# Patient Record
Sex: Male | Born: 1975 | Race: White | Hispanic: No | Marital: Single | Smoking: Current every day smoker
Health system: Southern US, Community
[De-identification: ages and names within clinical notes are randomized; demographics above are authoritative.]

## PROBLEM LIST (undated history)

## (undated) DIAGNOSIS — G8929 Other chronic pain: Secondary | ICD-10-CM

## (undated) DIAGNOSIS — M549 Dorsalgia, unspecified: Secondary | ICD-10-CM

## (undated) HISTORY — PX: LUNG SURGERY: SHX703

## (undated) HISTORY — PX: APPENDECTOMY: SHX54

## (undated) HISTORY — PX: WRIST SURGERY: SHX841

## (undated) HISTORY — PX: NOSE SURGERY: SHX723

---

## 2002-02-19 ENCOUNTER — Encounter: Payer: Self-pay | Admitting: Thoracic Surgery (Cardiothoracic Vascular Surgery)

## 2002-02-19 ENCOUNTER — Encounter: Payer: Self-pay | Admitting: Emergency Medicine

## 2002-02-19 ENCOUNTER — Observation Stay (HOSPITAL_COMMUNITY): Admission: EM | Admit: 2002-02-19 | Discharge: 2002-02-19 | Payer: Self-pay | Admitting: Emergency Medicine

## 2002-02-24 ENCOUNTER — Encounter: Payer: Self-pay | Admitting: Thoracic Surgery (Cardiothoracic Vascular Surgery)

## 2002-02-24 ENCOUNTER — Encounter
Admission: RE | Admit: 2002-02-24 | Discharge: 2002-02-24 | Payer: Self-pay | Admitting: Thoracic Surgery (Cardiothoracic Vascular Surgery)

## 2002-03-29 ENCOUNTER — Encounter: Payer: Self-pay | Admitting: Thoracic Surgery (Cardiothoracic Vascular Surgery)

## 2002-03-30 ENCOUNTER — Encounter (INDEPENDENT_AMBULATORY_CARE_PROVIDER_SITE_OTHER): Payer: Self-pay | Admitting: Specialist

## 2002-03-30 ENCOUNTER — Inpatient Hospital Stay (HOSPITAL_COMMUNITY)
Admission: RE | Admit: 2002-03-30 | Discharge: 2002-04-03 | Payer: Self-pay | Admitting: Thoracic Surgery (Cardiothoracic Vascular Surgery)

## 2002-03-30 ENCOUNTER — Encounter: Payer: Self-pay | Admitting: Thoracic Surgery (Cardiothoracic Vascular Surgery)

## 2002-03-31 ENCOUNTER — Encounter: Payer: Self-pay | Admitting: Thoracic Surgery

## 2002-04-01 ENCOUNTER — Encounter: Payer: Self-pay | Admitting: Thoracic Surgery

## 2002-04-01 ENCOUNTER — Encounter: Payer: Self-pay | Admitting: Thoracic Surgery (Cardiothoracic Vascular Surgery)

## 2002-04-02 ENCOUNTER — Encounter: Payer: Self-pay | Admitting: Thoracic Surgery (Cardiothoracic Vascular Surgery)

## 2002-04-03 ENCOUNTER — Encounter: Payer: Self-pay | Admitting: Thoracic Surgery

## 2002-04-21 ENCOUNTER — Encounter: Payer: Self-pay | Admitting: Thoracic Surgery (Cardiothoracic Vascular Surgery)

## 2002-04-21 ENCOUNTER — Encounter
Admission: RE | Admit: 2002-04-21 | Discharge: 2002-04-21 | Payer: Self-pay | Admitting: Thoracic Surgery (Cardiothoracic Vascular Surgery)

## 2002-10-24 ENCOUNTER — Emergency Department (HOSPITAL_COMMUNITY): Admission: EM | Admit: 2002-10-24 | Discharge: 2002-10-24 | Payer: Self-pay | Admitting: Emergency Medicine

## 2002-10-24 ENCOUNTER — Encounter: Payer: Self-pay | Admitting: Emergency Medicine

## 2002-11-18 ENCOUNTER — Emergency Department (HOSPITAL_COMMUNITY): Admission: EM | Admit: 2002-11-18 | Discharge: 2002-11-18 | Payer: Self-pay | Admitting: Emergency Medicine

## 2003-01-12 ENCOUNTER — Emergency Department (HOSPITAL_COMMUNITY): Admission: EM | Admit: 2003-01-12 | Discharge: 2003-01-12 | Payer: Self-pay | Admitting: Emergency Medicine

## 2003-01-12 ENCOUNTER — Encounter: Payer: Self-pay | Admitting: Emergency Medicine

## 2003-04-14 ENCOUNTER — Emergency Department (HOSPITAL_COMMUNITY): Admission: EM | Admit: 2003-04-14 | Discharge: 2003-04-14 | Payer: Self-pay | Admitting: Emergency Medicine

## 2003-07-27 ENCOUNTER — Emergency Department (HOSPITAL_COMMUNITY): Admission: EM | Admit: 2003-07-27 | Discharge: 2003-07-27 | Payer: Self-pay | Admitting: Emergency Medicine

## 2003-07-27 ENCOUNTER — Encounter: Payer: Self-pay | Admitting: Emergency Medicine

## 2003-08-02 ENCOUNTER — Emergency Department (HOSPITAL_COMMUNITY): Admission: EM | Admit: 2003-08-02 | Discharge: 2003-08-02 | Payer: Self-pay

## 2003-08-18 ENCOUNTER — Emergency Department (HOSPITAL_COMMUNITY): Admission: EM | Admit: 2003-08-18 | Discharge: 2003-08-18 | Payer: Self-pay | Admitting: Emergency Medicine

## 2003-11-01 ENCOUNTER — Ambulatory Visit (HOSPITAL_COMMUNITY): Admission: RE | Admit: 2003-11-01 | Discharge: 2003-11-01 | Payer: Self-pay | Admitting: Orthopedic Surgery

## 2003-12-23 ENCOUNTER — Ambulatory Visit (HOSPITAL_BASED_OUTPATIENT_CLINIC_OR_DEPARTMENT_OTHER): Admission: RE | Admit: 2003-12-23 | Discharge: 2003-12-23 | Payer: Self-pay | Admitting: Orthopedic Surgery

## 2005-03-06 ENCOUNTER — Encounter: Admission: RE | Admit: 2005-03-06 | Discharge: 2005-03-06 | Payer: Self-pay | Admitting: Otolaryngology

## 2005-03-19 ENCOUNTER — Emergency Department (HOSPITAL_COMMUNITY): Admission: EM | Admit: 2005-03-19 | Discharge: 2005-03-19 | Payer: Self-pay | Admitting: Emergency Medicine

## 2006-12-22 ENCOUNTER — Emergency Department (HOSPITAL_COMMUNITY): Admission: EM | Admit: 2006-12-22 | Discharge: 2006-12-22 | Payer: Self-pay | Admitting: Family Medicine

## 2007-01-28 ENCOUNTER — Emergency Department (HOSPITAL_COMMUNITY): Admission: EM | Admit: 2007-01-28 | Discharge: 2007-01-28 | Payer: Self-pay | Admitting: Family Medicine

## 2007-03-30 ENCOUNTER — Emergency Department (HOSPITAL_COMMUNITY): Admission: EM | Admit: 2007-03-30 | Discharge: 2007-03-30 | Payer: Self-pay | Admitting: Family Medicine

## 2011-04-19 NOTE — Op Note (Signed)
NAME:  Tyler Mcdaniel, Tyler Mcdaniel                           ACCOUNT NO.:  [id number]   MEDICAL RECORD NO.:  [id number]                   PATIENT TYPE:  AMB   LOCATION:  DSC                                  FACILITY:  [facility]   PHYSICIAN:  Harvie Junior, M.D.                DATE OF BIRTH:  [birth year]   DATE OF PROCEDURE:  12/23/2003  DATE OF DISCHARGE:                                 OPERATIVE REPORT   PREOPERATIVE DIAGNOSIS:  Scapholunate ligament injury with possible chondral  flap.   POSTOPERATIVE DIAGNOSIS:  Radioscaphocapitate ligament tear with some radial  and ulnar side synovitis.   PROCEDURE:  Debridement of radioscaphocapitate ligament with debridement of  radial and ulnar synovitis and debridement of partial tear of the  scapholunate ligament.   SURGEON:  Harvie Junior, M.D.   ASSISTANT:  __________   ANESTHESIA:  General.   HISTORY:  A 35 year old male with a long history of having had pain in the  wrist.  He had also been treated with anti-inflammatory medication and  activity modification and been seen by Dr. Mina Marble who is concerned a  little bit about a hamate fracture which ultimately had resolved.  He had  two MR arthrograms and a CT which essentially were read as normal.  Because  of this, he was ultimately evaluated.  He had been working throughout but  still persisted in having some dorsal wrist pain, some catching pain in the  wrist, and we felt that he improved significantly with an injection and  ultimately felt that given the improvement he had with an injection that we  ought to consider arthroscopic evaluation of his wrist and he was brought to  the operating room for this procedure.   DESCRIPTION OF PROCEDURE:  The patient was brought to the operating room and  after adequate anesthesia was obtained with general anesthesia, the patient  was placed on the operating table.  The right wrist was prepped and draped  in the usual sterile fashion.  Following this,  routine arthroscopic  examination of the wrist after exsanguination of the extremity and inflating  a tourniquet to 250 mmHg, showed that there was some tearing in the  radioscaphocapitate ligament.  This was debrided and the ligamentous  complexes were clearly defined and there seemed to be more on the lunate  side of the radioscapholunate ligament.  Once this was debrided, attention  was turned to the ulnar side of the wrist.  Triangular fibrocartilage was  identified and noted to be within normal limits.  The ulnar ligamentous  complex was completely normal.  There was a little bit of dorsal synovitis  and a little bit of chondral fray on the back side of the lunate.  This was  debrided ulnarly.  The camera was then reversed into the ulnar portal and  the radial side showed some dorsal side synovitis but the radial ligamentous  complex was graded, as well as the scaphoid and the articular surface on the  scaphoid were without significant abnormality.  At this point the wrist was  copiously irrigated and suctioned dry.  The arthroscopic ports were closed  with a bandage.  Sterile compressive dressing was applied as well as a volar  plaster splint and the patient was taken to the recovery room and noted to  be in satisfactory condition.  Estimated blood loss for the procedure was  none.                                               Harvie Junior, M.D.   Ranae Plumber  D:  12/23/2003  T:  12/23/2003  Job:  [id number]

## 2011-04-19 NOTE — H&P (Signed)
[place]. [facility]  Patient:    Tyler Mcdaniel, Tyler Mcdaniel Visit Number: [id number] MRN: [id number]          Service Type: MED Location: [id number] Attending Physician:  Charlett Lango Dictated by:   Sherrie George, P.A. Admit Date:  02/19/2002 Discharge Date: 02/19/2002                           History and Physical  DATE OF BIRTH:  12/14/1975  HISTORY OF PRESENT ILLNESS:  The patient is a 35 year old white male with three spontaneous left pneumothoraces and left VATS/stapling in August 2002, now presents with a second right pneumothorax.  The patient presented with acute onset of right-sided chest pain and breath sounds slightly diminished on the left.  Chest x-ray showed about a 20% right pneumothorax.  He was quite stable and was tolerating it well.  He was admitted for observation around 3 a.m. by Dr. Dorris Fetch.  Chest x-ray six hours later reveals the right-sided pneumothorax is still between 20% and 25%, minimally larger.  We plan to recheck the x-ray at 3 p.m. - which will be 12 hours - and see if there are any changes and decide on the need for a chest tube or follow-up as an outpatient.  PAST MEDICAL HISTORY: 1. He has had three left pneumothoraces with a left video-assisted    thoracoscopy, stapling of these blebs in August 2002 by Dr. Jabier Mutton in    [facility]. 2. He has had one prior right pneumothorax; this is his second on the right.  ALLERGIES:  None known.  HABITS:  ETOH:  About a six-pack per week.  Cigarettes:  About one pack per day.  Drugs:  None.  CURRENT MEDICATIONS:  None.  REVIEW OF SYSTEMS:  Was noncontributory and negative.  FAMILY HISTORY:  His father is living at age 51 but has already had one MI; he is a smoker.  His mother is living and in good health at age 57.  SOCIAL HISTORY:  He is single, works as a Geologist, engineering.  PHYSICAL EXAMINATION:  GENERAL:  Well-nourished, well-developed white male in no acute  distress.  HEENT:  Head:  Normocephalic.  Eyes:  PERRLA, EOMs intact.  Fundi not visualized.  Ears, nose, throat, and mouth:  Grossly within normal limits.  NECK:  No bruits.  CHEST:  Clear to auscultation and percussion.  Breath sounds are slightly diminished on the right.  HEART:  No murmurs, rubs, or gallops.  Pulses are +2 and equal bilaterally throughout the distribution.  ABDOMEN:  Soft, nontender, positive bowel sounds.  No palpable organomegaly.  GENITOURINARY/RECTAL:  Not performed, not medically indicated.  EXTREMITIES:  No clubbing, cyanosis, or edema.  NEUROLOGIC:  No focal deficits.  IMPRESSION: 1. Recurrent right spontaneous pneumothorax. 2. Status post left pneumothorax x3 status post video-assisted thoracoscopy    stapling on left.Dictated by:   Sherrie George, P.A. Attending Physician:  Charlett Lango DD:  02/19/02 TD:  02/20/02 Job: [id number]

## 2011-04-19 NOTE — H&P (Signed)
[place]. [facility]  Patient:    Tyler Mcdaniel, Tyler Mcdaniel Visit Number: [id number] MRN: [id number]          Service Type: SUR Location: [id number] Attending Physician:  Charlett Lango Dictated by:   Adair Patter, P.A. Admit Date:  03/30/2002 Discharge Date: 04/03/2002   CC:         CVTS Office   History and Physical  CHIEF COMPLAINT:  Right spontaneous pneumothorax.  HISTORY OF PRESENT ILLNESS:  This is a 35 year old male who has a history of a right spontaneous pneumothorax x 2.  The patient has a history of three left-sided spontaneous pneumothoraxes which eventually required left video-assisted thoracoscopy and bleb stabling.  Approximately one month ago, the patient had his second right spontaneous pneumothorax, which was treated with observation by Viviann Spare C. Dorris Fetch, M.D.  The patient currently denies any shortness of breath of pleuritic chest pain.  He says that he has not had any weight loss, cough, or sputum production.  PAST MEDICAL HISTORY: 1. History of left spontaneous pneumothorax x 3. 2. History of right spontaneous pneumothorax x 2.  PAST SURGICAL HISTORY:  Left video-assisted thoracoscopy with bleb stabling.  ALLERGIES:  The patient denies any known medication allergies.  MEDICATIONS:  The patient says that he currently takes no medications.  FAMILY HISTORY:  There is no family history of cancer, diabetes, or cerebrovascular accident.  His father is still living and had a heart attack and has hypertension.  SOCIAL HISTORY:  The patient is single.  He lives with his grandmother.  He drinks three to four drinks per week.  He smokes one pack of cigarettes per day and has done so for about seven years.  REVIEW OF SYSTEMS:  General:  The patient denies any fever, chills, night sweats, or frequent illnesses.  Head:  The patient denies any head injuries or loss of consciousness.  Eyes:  The patient wears corrective lenses.  He  denies any glaucoma, cataracts, or diplopia.  Ears:  The patient denies any hearing loss, ear infections, vertigo, or tenderness.  Nose:  The patient denies any epistaxis, dry nose, or sinusitis.  Mouth:  The patient denies any problems with dentition or frequent sore throats.  Neck:  The patient denies any lumps, masses or pain on range of motion in his neck.  Cardiovascular:  The patient denies any cardiac arrhythmias, hypertension, or angina.  Pulmonary:  The patient denies any asthma, bronchitis, emphysema, or pneumonia.  GI:  The patient denies any nausea, vomiting, diarrhea, constipation, hematochezia, or melena.  Endocrine:  The patient denies any diabetes or thyroid disease.  GU: The patient denies any urinary tract infections or incontinence. Musculoskeletal:  The patient denies any arthritis, arthralgias, or myalgias. Neurologic:  The patient denies any memory loss, depression, stroke, or transient ischemic attack.  PHYSICAL EXAMINATION:  VITAL SIGNS:  Blood pressure 110/70 bilaterally, pulse 84 and regular, respirations 16.  GENERAL APPEARANCE:  The patient is alert and oriented x 3.  He is in no distress.  HEENT:  Head:  Normocephalic and atraumatic.  Eyes:  The pupils are equal, round, and reactive to light and accommodation.  Extraocular movements are intact.  No scleral icterus or nystagmus.  Ears:  Auditory acuity is grossly intact.  Nose:  Nasal patency is intact.  The sinuses are nontender.  Mouth: Moist without exudates.  NECK:  Supple without JVD, lymphadenopathy, carotid bruits, or thyromegaly.  CARDIOVASCULAR:  Regular rate and rhythm without murmurs, rubs, or gallops.  LUNGS:  Bilaterally clear to auscultation without rhonchi, rales, or wheezes.  ABDOMEN:  Soft, nontender, and nondistended.  Positive bowel sounds in all four quadrants.  EXTREMITIES:  No clubbing, cyanosis, or edema.  NEUROLOGIC:  Cranial nerves II-XII grossly intact.  No focal  neurologic deficits are noted.  He has 5+ equal muscle strength in all four extremities.  IMPRESSION:  Right spontaneous pneumothorax.  PLAN:  The patient will be admitted to [facility]. [facility], at which time he will undergo a right video-assisted thoracoscopy with blebectomy and mechanical pleurodesis by Salvatore Decent. Dorris Fetch, M.D. Dictated by:   Adair Patter, P.A. Attending Physician:  Charlett Lango DD:  03/29/02 TD:  03/29/02 Job: [id number]

## 2011-04-19 NOTE — Discharge Summary (Signed)
. [facility]  Patient:    Tyler Mcdaniel, Tyler Mcdaniel Visit Number: [id number] MRN: [id number]          Service Type: SUR Location: 3300 [id number] [id number] Attending Physician:  Charlett Lango Dictated by:   Sherrie George, P.A. Admit Date:  03/30/2002 Discharge Date: 04/03/2002                             Discharge Summary  DATE OF BIRTH: 1976-07-25.  ADMISSION DIAGNOSES: 1. Recurrent right pneumothorax. 2. Status post spontaneous left pneumothorax times three with prior left ___    and stapling of blebs.  DISCHARGE DIAGNOSES: 1. Recurrent right pneumothorax. 2. Status post spontaneous left pneumothorax times three with prior left ___    and stapling of blebs.  PROCEDURES: 1. Right video assisted thoracoscopy. 2. Apical blebectomy. 3. Pleurectomy, pleural abrasion on March 30, 2002 by Dr. Dorris Fetch.  HISTORY OF PRESENT ILLNESS: The patient is a 35 year old white male in good health except for recurrent pneumothorax as he has had three on the left and has undergone left video assisted thoracoscopy and bleb stapling. He returns again with recurrent pneumothorax on the right. It was Dr. Rondel Jumbo opinion that he should undergo right video assisted thoracoscopy and stapling of blebs. The risks and benefits were discussed in detail. Informed consent was obtained. He was admitted electively at this time for surgery.  PAST MEDICAL HISTORY: As above.  ALLERGIES: None known.  MEDICATIONS: None.  FOR FURTHER HISTORY AND PHYSICAL, PLEASE SEE THE DICTATED NOTE.  HOSPITAL COURSE: The patient was admitted and taken to the operating room and underwent the procedure as described above. He tolerated the procedure well. He returned to the recovery room at 3300 in satisfactory condition. Pain was controlled postoperatively with Toradol and Morphine PCA. He had no air leak the first postoperative day but was continued on suction. On the second postoperative  morning, he had no air leak. Chest x-ray showed the lung to be expanded and he was placed on air seal. Repeat chest x-ray at noon showed the lung remained expanded and the chest tube was removed. He is being weaned off the Morphine PCA and switched over to oral pain medications. He had his first dose of Percocet today and developed itching. He had no other problems and this resolved with some Benadryl. The Percocet was discontinued. He was placed on p.o. Dilaudid. We plan to start him on p.o. Motrin as soon as the Toradol is completed. We will repeat his chest x-ray after the chest tube removal and again in the morning on Apr 02, 2002. If his lung remains expanded, we will plan to discharge him home in the morning of Apr 02, 2002.  DISCHARGE MEDICATION: 1. Tylenol or Ibuprofen p.r.n. 2. Dilaudid 1 mg one p.o. q.4h. p.r.n.  FOLLOW-UP: To return for suture removal on Tuesday, Apr 06, 2002, at 9:30 am and he will return to see Dr. Dorris Fetch on Wednesday, Apr 21, 2002 at 9:45 am with the chest x-ray from the [facility].  DISCHARGE CONDITION: Improving.  DISCHARGE LABORATORY DATA: Electrolytes are normal. BUN is 4, creatinine 0.8, calcium 8.9, white count on March 31, 2002 postop was 19.4. Hemoglobin 13.9 with a hematocrit of 39.6. Dictated by:   Sherrie George, P.A. Attending Physician:  Charlett Lango DD:  04/01/02 TD:  04/05/02 Job: [id number]

## 2011-04-19 NOTE — Op Note (Signed)
[place]. [facility]  Patient:    Tyler Mcdaniel, TAPP Visit Number: [id number] MRN: [id number]          Service Type: SUR Location: 3300 3305 [id number] Attending Physician:  Charlett Lango Dictated by:   Salvatore Decent Dorris Fetch, M.D. Proc. Date: 03/30/02 Admit Date:  03/30/2002                             Operative Report  PREOPERATIVE DIAGNOSIS:  Recurrent right spontaneous pneumothorax.  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  PROCEDURES:  Right video-assisted thoracoscopy, apical blebectomy, apical pleurectomy, and pleural abrasion.  SURGEON:  Salvatore Decent. Dorris Fetch, M.D.  ASSISTANT:  Sherrie George, P.A.  ANESTHESIA:  General.  FINDINGS:  Severe apical bleb disease.  No blebs in superior segment of right lower lobe.  Small blebs on inferior edge of right upper lobe.  CLINICAL NOTE:  Mr. Tyler Mcdaniel is a 35 year old gentleman with a history of multiple left spontaneous pneumothoraces and previous left VATS.  He presented to the [facility] Emergency Room with a recurrent right spontaneous pneumothorax. This was observed and resolved without chest tube placement; however, because this was the second episode and given his history on the left side, he was advised to undergo video-assisted thoracoscopy and apical blebectomy.  The indications, risks, benefits, and alternative procedures were discussed in detail with the patient, and he understood and accepted the risks and agreed to proceed.  DESCRIPTION OF PROCEDURE:  Tyler Mcdaniel was brought to the preop holding area on March 30, 2002.  Intravenous access was obtained, and an arterial blood pressure monitoring catheter was placed.  The patient was taken to the operating room, anesthetized, and intubated with a double-lumen endotracheal tube.  PAS hose were placed for DVT prophylaxis.  Intravenous antibiotics were administered.  The patient was placed in a left  lateral decubitus position. The right chest was prepped and draped in the usual fashion.  Single-lung ventilation was carried out of the left lung, and the right lung was deflated.  A transverse incision was made in the seventh intercostal space in the midaxillary line.  It was carried through the skin and subcutaneous tissue. The chest was entered bluntly using a hemostat.  A port was inserted, the thoracoscope was inserted.  There was good visualization.  There was good deflation of the right lung, and there was clear separation of the three lobes.  There also was a partial fissure between the superior segment and the basilar segments of the lower lobe.  There were no blebs present in the superior segment, which was thoroughly inspected, nor was there any evidence of blebs in the lower or middle lobes.  There were no abnormalities of the chest wall or the diaphragm.  The right upper lobe was inspected, and there were tiny blebs along the inferior anterior edge of the right upper lobe.  The apex was inspected, and there was severe bleb disease with evidence of inflammation at the apex.  There were no visceral to parietal pleural adhesions.  Additional ports were placed anteriorly and posteriorly at approximately the fifth intercostal space.  In both cases the chest was entered with thoracoscopic visualization.  The apical blebs were grasped, assuring that all the blebs were removed along with a thin margin of normal lung.  The apical blebs were resected with multiple firings of the ATB-45 stapler with 3.5 mm staple depths.  After the final firing of  the stapler, the specimen was removed through the posterior incision.  There was some disruption of the blebs with removal through the chest wall.  There had been no active leak at the time the patient was put to sleep.  There was evidence of inflammation consistent with previous bleb rupture.  The staple line was inspected thoracoscopically,  and there was no significant bleeding from the staple line.  The blebs along the inferior anterior edge of the right upper lobe then were resected with two firings of the ATB-45 stapler.  The pleura was grasped at the apex and stripped, beginning at the apex circumferentially to approximately the level of the third rib.  The remainder of the pleura was gently abraded.  A 28 French chest tube was placed through the midaxillary line incision directed to the apex under thoracoscopic visualization.  The lung then was inflated.  The chest tube was secured.  The remaining two incisions were closed with 0 Vicryl fascial suture, 2-0 Vicryl subcutaneous suture, and a 3-0 Vicryl subcuticular suture.  All sponge, needle, and instrument counts were correct at the end of the procedure.  The patient tolerated the procedure well.  The chest tube was placed to suction.  The patient was placed in the supine position and was extubated in the operating room and taken to the recovery room in stable condition. Dictated by:   Salvatore Decent Dorris Fetch, M.D. Attending Physician:  Charlett Lango DD:  03/30/02 TD:  03/31/02 Job: [id number]

## 2011-06-21 ENCOUNTER — Other Ambulatory Visit: Payer: Self-pay | Admitting: Family Medicine

## 2011-06-21 ENCOUNTER — Ambulatory Visit
Admission: RE | Admit: 2011-06-21 | Discharge: 2011-06-21 | Disposition: A | Discharge status: Home / Self Care | Payer: BC Managed Care – PPO | Source: Ambulatory Visit | Attending: Family Medicine | Admitting: Family Medicine

## 2011-06-21 ENCOUNTER — Inpatient Hospital Stay (INDEPENDENT_AMBULATORY_CARE_PROVIDER_SITE_OTHER)
Admission: RE | Admit: 2011-06-21 | Discharge: 2011-06-21 | Disposition: A | Discharge status: Home / Self Care | Payer: BC Managed Care – PPO | Source: Ambulatory Visit | Attending: Family Medicine | Admitting: Family Medicine

## 2011-06-21 ENCOUNTER — Encounter: Payer: Self-pay | Admitting: Family Medicine

## 2011-06-21 DIAGNOSIS — M533 Sacrococcygeal disorders, not elsewhere classified: Secondary | ICD-10-CM

## 2011-06-21 DIAGNOSIS — M545 Low back pain: Secondary | ICD-10-CM

## 2011-06-21 DIAGNOSIS — M76899 Other specified enthesopathies of unspecified lower limb, excluding foot: Secondary | ICD-10-CM

## 2011-06-23 ENCOUNTER — Telehealth (INDEPENDENT_AMBULATORY_CARE_PROVIDER_SITE_OTHER): Payer: Self-pay | Admitting: Emergency Medicine

## 2011-06-28 ENCOUNTER — Encounter: Payer: Self-pay | Admitting: Family Medicine

## 2011-06-28 ENCOUNTER — Inpatient Hospital Stay (INDEPENDENT_AMBULATORY_CARE_PROVIDER_SITE_OTHER)
Admission: RE | Admit: 2011-06-28 | Discharge: 2011-06-28 | Disposition: A | Discharge status: Home / Self Care | Payer: BC Managed Care – PPO | Source: Ambulatory Visit | Attending: Family Medicine | Admitting: Family Medicine

## 2011-06-28 DIAGNOSIS — M545 Low back pain: Secondary | ICD-10-CM

## 2011-06-28 DIAGNOSIS — M76899 Other specified enthesopathies of unspecified lower limb, excluding foot: Secondary | ICD-10-CM

## 2011-06-28 DIAGNOSIS — M533 Sacrococcygeal disorders, not elsewhere classified: Secondary | ICD-10-CM

## 2011-07-01 ENCOUNTER — Telehealth (INDEPENDENT_AMBULATORY_CARE_PROVIDER_SITE_OTHER): Payer: Self-pay | Admitting: Emergency Medicine

## 2011-07-02 ENCOUNTER — Ambulatory Visit (INDEPENDENT_AMBULATORY_CARE_PROVIDER_SITE_OTHER): Payer: BC Managed Care – PPO | Admitting: Family Medicine

## 2011-07-02 ENCOUNTER — Encounter: Payer: Self-pay | Admitting: Family Medicine

## 2011-07-02 VITALS — BP 138/88 | HR 85 | Temp 98.1°F | Ht 76.0 in | Wt 183.6 lb

## 2011-07-02 DIAGNOSIS — M545 Low back pain: Secondary | ICD-10-CM

## 2011-07-02 MED ORDER — PREDNISONE (PAK) 10 MG PO TABS: ORAL_TABLET | ORAL | Status: AC

## 2011-07-02 MED ORDER — DICLOFENAC SODIUM 75 MG PO TBEC: 75.0000 mg | DELAYED_RELEASE_TABLET | Freq: Two times a day (BID) | ORAL | Status: AC

## 2011-07-02 MED ORDER — TRAMADOL HCL 50 MG PO TABS: 50.0000 mg | ORAL_TABLET | Freq: Four times a day (QID) | ORAL | Status: AC | PRN

## 2011-07-02 NOTE — Patient Instructions (Signed)
You have lumbar radiculopathy (a pinched nerve in your low back). Take tylenol for baseline pain relief (1-2 extra strength tabs 3x/day) A prednisone dose pack is the best option for immediate relief and may be prescribed with transition to an anti-inflammatory like diclofenac (if you do not have stomach or kidney issues). Tramadol as needed for severe pain (no driving on this medicine). Stay as active as possible. Physical therapy has been shown to be helpful as well - start this and do the home stretches and exercises as directed. Strengthening of low back muscles, abdominal musculature are key for long term pain relief. We will obtain your records from [facility] too in case we need to move forward with imaging, injections. If not improving, will consider further imaging (MRI) and/or other medications (neurontin, lyrica, nortriptyline) that help with pain. Follow up with me in 6 weeks for a recheck on your progress.

## 2011-07-04 ENCOUNTER — Encounter: Payer: Self-pay | Admitting: Family Medicine

## 2011-07-04 NOTE — Progress Notes (Addendum)
  Subjective:    Patient ID: Tyler Mcdaniel, male    DOB: 12-07-1975, 35 y.o.   MRN: [id number]  PCP: None listed  HPI 35 yo M here for low back pain.  Patient has longstanding history of low back pain for several years. Reports about 3 weeks ago this started flaring up again in low back with radiation into both buttocks and lateral hips. Did not have a new injury. Pain worse with all movements of back. Has previously seen orthopedic group for this, had MRIs and underwent ESIs for low back. Has also completed courses of PT for low back and for neck. Left side low back worse than right. Does wake him up at night. Numbness is local in low back. No bowel/bladder dysfunction. Tried naproxen, mobic, flexeril, ice pack.  Naproxen helped some.  History reviewed. No pertinent past medical history.  No current outpatient prescriptions on file prior to visit.    Past Surgical History  Procedure Date  . Lung surgery     x2 - did not elaborate on intake form  . Wrist surgery   . Appendectomy     No Known Allergies  History   Social History  . Marital Status: Single    Spouse Name: N/A    Number of Children: N/A  . Years of Education: N/A   Occupational History  . Not on file.   Social History Main Topics  . Smoking status: Current Everyday Smoker -- 1.5 packs/day    Types: Cigarettes  . Smokeless tobacco: Not on file  . Alcohol Use: Not on file  . Drug Use: Not on file  . Sexually Active: Not on file   Other Topics Concern  . Not on file   Social History Narrative  . No narrative on file    Family History  Problem Relation Age of Onset  . Heart attack Mother   . Heart attack Father   . Stroke Father   . Heart attack Maternal Grandfather   . Sudden death Maternal Grandfather   . Diabetes Neg Hx   . Hyperlipidemia Neg Hx   . Hypertension Neg Hx     BP 138/88  Pulse 85  Temp(Src) 98.1 F (36.7 C) (Oral)  Ht 6\' 4"  (1.93 m)  Wt 183 lb 9.6 oz (83.28 kg)  BMI  22.35 kg/m2  Review of Systems See HPI above.    Objective:   Physical Exam Gen: NAD  Back: No gross deformity, scoliosis. TTP lumbar paraspinal L > R.  No bony TTP.  TTP less in bilateral buttocks and bilateral trochanters. FROM including hips (negative logrolls). Strength LEs 5/5 all muscle groups.   1+ MSRs in patellar and achilles tendons, equal bilaterally. Negative SLRs. Sensation intact to light touch bilaterally. Negative fabers and piriformis stretches.    Assessment & Plan:  1. Chronic low back pain - consistent with musculoskeletal pain.  Only mild relief with NSAIDs so will try prednisone with transition to voltaren.  Tramadol, flexeril as needed.  Ice or heat, whichever works better for him.  Start PT again with home stretches/exercises.  Will obtain records from his prior orthopedic group.  If not improving over next 6 weeks, consider repeat MRI and/or ESIs depending on when his last MRI was.  Addendum: Patient's MRI report obtained from [facility].  Dated 07/23/08 it shows mild diffuse facet degenerative changes.  No disc herniation, central, or foraminal stenosis.  No nerve impingement.  Will be scanned into chart.

## 2011-07-06 DIAGNOSIS — M545 Low back pain: Secondary | ICD-10-CM | POA: Insufficient documentation

## 2011-07-06 NOTE — Assessment & Plan Note (Signed)
consistent with musculoskeletal pain.  Only mild relief with NSAIDs so will try prednisone with transition to voltaren.  Tramadol, flexeril as needed.  Ice or heat, whichever works better for him.  Start PT again with home stretches/exercises.  Will obtain records from his prior orthopedic group.  If not improving over next 6 weeks, consider repeat MRI and/or ESIs depending on when his last MRI was.

## 2011-08-13 ENCOUNTER — Ambulatory Visit: Payer: BC Managed Care – PPO | Admitting: Family Medicine

## 2011-11-04 NOTE — Telephone Encounter (Signed)
  Phone Note Outgoing Call Call back at [facility]   Call placed by: Emilio Math,  July 01, 2011 9:49 AM Call placed to: Specialist Summary of Call: faxed Referral to Dr Lazaro Arms office, they will contact patient for appointments.

## 2011-11-04 NOTE — Progress Notes (Signed)
Summary: hip and back pain (room 5)   Vital Signs:  Patient Profile:   35 Years Old Male CC:      low back pain radiating to hips x2 weeks Height:     75 inches Weight:      186 pounds O2 Sat:      100 % O2 treatment:    Room Air Temp:     98.5 degrees F oral Pulse rate:   79 / minute Resp:     16 per minute BP sitting:   136 / 84  (left arm) Cuff size:   large  Pt. in pain?   yes  Vitals Entered By: Lavell Islam RN (June 21, 2011 6:11 PM)                   Updated Prior Medication List: FLEXERIL 10 MG TABS (CYCLOBENZAPRINE HCL) as needed for pain/muscle spasms * NAPROSYN MG TABS (NAPROXEN) as needed pain  Current Allergies: ! PERCOCETHistory of Present Illness Chief Complaint: low back pain radiating to hips x2 weeks History of Present Illness:  Subjective:  Patient complains of onset of mild low back ache about 2 weeks ago while working around the house.  The pain was mostly central and sharp.  The pain has become worse over the past week and now radiates to both hips, worse on the left.  The pain is relieved somewhat by changes in position, but recurs.  No bowel or bladder dysfunction.  No saddle numbness.  No recent injury.  No recent change in physical activities.  No fevers, chills, and sweats.  No recent unplanned weight loss. He had an episode of mid-back pain several years ago that resolved with therapy.  REVIEW OF SYSTEMS Constitutional Symptoms      Denies fever, chills, night sweats, weight loss, weight gain, and fatigue.  Eyes       Denies change in vision, eye pain, eye discharge, glasses, contact lenses, and eye surgery. Ear/Nose/Throat/Mouth       Denies hearing loss/aids, change in hearing, ear pain, ear discharge, dizziness, frequent runny nose, frequent nose bleeds, sinus problems, sore throat, hoarseness, and tooth pain or bleeding.  Respiratory       Denies dry cough, productive cough, wheezing, shortness of breath, asthma, bronchitis, and  emphysema/COPD.  Cardiovascular       Denies murmurs, chest pain, and tires easily with exhertion.    Gastrointestinal       Denies stomach pain, nausea/vomiting, diarrhea, constipation, blood in bowel movements, and indigestion. Genitourniary       Denies painful urination, kidney stones, and loss of urinary control. Neurological       Denies headaches, loss of or changes in sensation, numbness, tngling, tremors, seizures, and fainting/blackouts. Musculoskeletal       Complains of muscle pain and joint stiffness.      Denies joint pain, decreased range of motion, redness, swelling, muscle weakness, and gout.  Skin       Denies bruising, unusual mles/lumps or sores, and hair/skin or nail changes.  Psych       Complains of mood changes.      Denies temper/anger issues, anxiety/stress, speech problems, depression, and sleep problems. Other Comments: low back pain radiating to both hips x 2 weeks   Past History:  Family History: Last updated: 06/21/2011 Family History of CAD Male 1st degree relative <60 Family History of CAD Male 1st degree relative <50  Social History: Last updated: 06/21/2011 Married Current Smoker 1 1/2ppd  Alcohol use-yes Drug use-no  Past Medical History: lung birth defects "blebs'' spontaneous pneumothorax  Past Surgical History: lung surgery x 2 Appendectomy  Family History: Family History of CAD Male 1st degree relative <60 Family History of CAD Male 1st degree relative <50  Social History: Married Current Smoker 1 1/2ppd Alcohol use-yes Drug use-no Smoking Status:  current Drug Use:  no   Objective:  Appearance:  Patient appears healthy, stated age, and in no acute distress.  He ambulates without difficulty Eyes:  Pupils are equal, round, and reactive to light and accomodation.  Extraocular movement is intact.  Conjunctivae are not inflamed.  Neck:  Supple.  No adenopathy is present.  Lungs:  Clear to auscultation.  Breath sounds are  equal.  Heart:  Regular rate and rhythm without murmurs, rubs, or gallops.  Abdomen:  Nontender without masses or hepatosplenomegaly.  Bowel sounds are present.  No CVA or flank tenderness.   Back: Can heel/toe walk and squat without difficulty.  Tenderness in the midline below L4.  There is tenderness over both SI joints.  Straight leg raising test is negative.  Sitting knee extension test is negative.  Strength and sensation in the lower extremities is normal.  Patellar and achilles reflexes are normal.  Extremities:  No edema.  Tenderness over the greater trochanters of both hips, left worse than right.  Resisted lateral abduction of the hips while palpating the greater trochanters recreates pain.  No leg length discrepancy noted. Skin:  No rash LS spine X-rays:  Normal lumbar spine. 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) BACK PAIN, LUMBAR (ICD-724.2) FAMILY HISTORY OF CAD MALE 1ST DEGREE RELATIVE <50 (ICD-V17.3) FAMILY HISTORY OF CAD MALE 1ST DEGREE RELATIVE <60 (ICD-V16.49)   Plan New Medications/Changes: CYCLOBENZAPRINE HCL 10 MG TABS (CYCLOBENZAPRINE HCL) One tab by mouth HS for muscle spasm  #15 x 0, 06/21/2011, Donna Christen MD TRAMADOL HCL 50 MG TABS (TRAMADOL HCL) One or two tabs by mouth hs as needed for pain  #20 x 0, 06/21/2011, Donna Christen MD MELOXICAM 15 MG TABS (MELOXICAM) One by mouth QAM with food  #15 x 1, 06/21/2011, Donna Christen MD  New Orders: T-DG Lumbar Spine Complete 5 views [72110] New Patient Level IV [99204] Services provided After hours-Weekends-Holidays [99051] Planning Comments:   Begin Meloxicam QAM.  Flexeril and analgesic at bedtime.  Begin applying ice pack to painful areas several times daily.  Begin stretching and range of motion exercises for SI joints and trochanteric bursitis ([facility] information and instruction patient handout given)  Followup with Sports Medicine  Clinic if not improved in two weeks.   The patient and/or caregiver has been counseled thoroughly with regard to medications prescribed including dosage, schedule, interactions, rationale for use, and possible side effects and they verbalize understanding.  Diagnoses and expected course of recovery discussed and will return if not improved as expected or if the condition worsens. Patient and/or caregiver verbalized understanding.  Prescriptions: CYCLOBENZAPRINE HCL 10 MG TABS (CYCLOBENZAPRINE HCL) One tab by mouth HS for muscle spasm  #15 x 0   Entered and Authorized by:   Donna Christen MD   Signed by:   Donna Christen MD on 06/21/2011   Method used:   Print then Give to Patient   RxID:   [id number] TRAMADOL HCL 50 MG TABS (TRAMADOL HCL) One or two tabs by mouth hs as needed for pain  #20 x 0   Entered and Authorized by:   Donna Christen MD  Signed by:   Donna Christen MD on 06/21/2011   Method used:   Print then Give to Patient   RxID:   [id number] MELOXICAM 15 MG TABS (MELOXICAM) One by mouth QAM with food  #15 x 1   Entered and Authorized by:   Donna Christen MD   Signed by:   Donna Christen MD on 06/21/2011   Method used:   Print then Give to Patient   RxID:   [id number]   Orders Added: 1)  T-DG Lumbar Spine Complete 5 views [72110] 2)  New Patient Level IV [99204] 3)  Services provided After hours-Weekends-Holidays [99051]

## 2011-11-04 NOTE — Letter (Signed)
Summary: Out of Work  [facility]  [address]   [place], [place] [address]   Phone: [phone]  Fax: [phone]    June 21, 2011   Employee:  SUFIAN RAVI    To Whom It May Concern:   For Medical reasons, the above named employee should avoid heavy lifting, pushing, pulling, and straining for one week.   If you need additional information, please feel free to contact our office.         Sincerely,    Donna Christen MD

## 2011-11-04 NOTE — Progress Notes (Signed)
Summary: BACK & HIP PAIN Room 3   Vital Signs:  Patient Profile:   35 Years Old Male CC:      Low back pain radiates into left hip, thigh and knee, at times worse than last week Height:     75 inches Weight:      184 pounds O2 Sat:      99 % O2 treatment:    Room Air Temp:     98.0 degrees F oral Pulse rate:   91 / minute Pulse rhythm:   regular Resp:     16 per minute BP sitting:   130 / 88  (left arm) Cuff size:   large  Vitals Entered By: Emilio Math (June 28, 2011 5:46 PM)                  Current Allergies (reviewed today): ! PERCOCETHistory of Present Illness Chief Complaint: Low back pain radiates into left hip, thigh and knee, at times worse than last week History of Present Illness:  Subjective:  Patient complains of continued pain in his lower back radiating into both hips.  He reports that Mobic helps, but wears off mid-day.  No bowel or bladder dysfunction.  No saddle numbness.  Current Meds NAPROXEN 500 MG TABS (NAPROXEN) One by mouth two times a day pc TRAMADOL HCL 50 MG TABS (TRAMADOL HCL) One or two tabs by mouth hs as needed for pain CYCLOBENZAPRINE HCL 10 MG TABS (CYCLOBENZAPRINE HCL) One tab by mouth HS for muscle spasm  REVIEW OF SYSTEMS Constitutional Symptoms      Denies fever, chills, night sweats, weight loss, weight gain, and fatigue.  Eyes       Denies change in vision, eye pain, eye discharge, glasses, contact lenses, and eye surgery. Ear/Nose/Throat/Mouth       Denies hearing loss/aids, change in hearing, ear pain, ear discharge, dizziness, frequent runny nose, frequent nose bleeds, sinus problems, sore throat, hoarseness, and tooth pain or bleeding.  Respiratory       Denies dry cough, productive cough, wheezing, shortness of breath, asthma, bronchitis, and emphysema/COPD.  Cardiovascular       Denies murmurs, chest pain, and tires easily with exhertion.    Gastrointestinal       Denies stomach pain, nausea/vomiting, diarrhea,  constipation, blood in bowel movements, and indigestion. Genitourniary       Denies painful urination, kidney stones, and loss of urinary control. Neurological       Denies paralysis, seizures, and fainting/blackouts. Musculoskeletal       Complains of muscle pain, joint pain, joint stiffness, and decreased range of motion.      Denies redness, swelling, muscle weakness, and gout.  Skin       Denies bruising, unusual mles/lumps or sores, and hair/skin or nail changes.  Psych       Denies mood changes, temper/anger issues, anxiety/stress, speech problems, depression, and sleep problems.  Past History:  Past Medical History: Reviewed history from 06/21/2011 and no changes required. lung birth defects "blebs'' spontaneous pneumothorax  Past Surgical History: Reviewed history from 06/21/2011 and no changes required. lung surgery x 2 Appendectomy  Family History: Reviewed history from 06/21/2011 and no changes required. Family History of CAD Male 1st degree relative <60 Family History of CAD Male 1st degree relative <50  Social History: Reviewed history from 06/21/2011 and no changes required. Married Current Smoker 1 1/2ppd Alcohol use-yes Drug use-no   Objective:  No acute distress  Back:  persistent tenderness both  SI joints. Hips:  persistent tenderness over both greatertrochanters. Neuro:  Patellar reflexes normal Assessment  Assessed TROCHANTERIC BURSITIS, BILATERAL as deteriorated - Donna Christen MD Assessed SACROILIAC JOINT DYSFUNCTION as deteriorated - Donna Christen MD Assessed BACK PAIN, LUMBAR as deteriorated - Donna Christen MD NO IMPROVEMENT  Plan New Medications/Changes: TRAMADOL HCL 50 MG TABS (TRAMADOL HCL) One or two tabs by mouth hs as needed for pain  #20 x 0, 06/28/2011, Donna Christen MD NAPROXEN 500 MG TABS (NAPROXEN) One by mouth two times a day pc  #20 x 1, 06/28/2011, Donna Christen MD  New Orders: Est. Patient Level III [phone] Services  provided After hours-Weekends-Holidays [99051] Sports Medicine [Sports Med] Planning Comments:   Switch to Naproxen two times a day.  Increase Tramadol to 100mg  at bedtime. Refer to Sports Med Clinic.   The patient and/or caregiver has been counseled thoroughly with regard to medications prescribed including dosage, schedule, interactions, rationale for use, and possible side effects and they verbalize understanding.  Diagnoses and expected course of recovery discussed and will return if not improved as expected or if the condition worsens. Patient and/or caregiver verbalized understanding.  Prescriptions: TRAMADOL HCL 50 MG TABS (TRAMADOL HCL) One or two tabs by mouth hs as needed for pain  #20 x 0   Entered and Authorized by:   Donna Christen MD   Signed by:   Donna Christen MD on 06/28/2011   Method used:   Print then Give to Patient   RxID:   [id number] NAPROXEN 500 MG TABS (NAPROXEN) One by mouth two times a day pc  #20 x 1   Entered and Authorized by:   Donna Christen MD   Signed by:   Donna Christen MD on 06/28/2011   Method used:   Print then Give to Patient   RxID:   [id number]   Orders Added: 1)  Est. Patient Level III [29528] 2)  Services provided After hours-Weekends-Holidays [41324] 3)  Sports Medicine [Sports Med]

## 2011-11-04 NOTE — Telephone Encounter (Signed)
  Phone Note Outgoing Call Call back at [facility] Phone [phone]   Call placed by: Lavell Islam RN,  June 23, 2011 4:40 PM Call placed to: Patient Action Taken: Phone Call Completed Summary of Call: Patient states he is definitely improving and more comfortable. Initial call taken by: Lavell Islam RN,  June 23, 2011 4:40 PM

## 2013-01-12 ENCOUNTER — Emergency Department (INDEPENDENT_AMBULATORY_CARE_PROVIDER_SITE_OTHER): Payer: BC Managed Care – PPO

## 2013-01-12 ENCOUNTER — Emergency Department
Admission: EM | Admit: 2013-01-12 | Discharge: 2013-01-12 | Disposition: A | Discharge status: Home / Self Care | Payer: BC Managed Care – PPO | Source: Home / Self Care | Attending: Family Medicine | Admitting: Family Medicine

## 2013-01-12 ENCOUNTER — Encounter: Payer: Self-pay | Admitting: *Deleted

## 2013-01-12 ENCOUNTER — Other Ambulatory Visit: Payer: BC Managed Care – PPO

## 2013-01-12 DIAGNOSIS — R1032 Left lower quadrant pain: Secondary | ICD-10-CM

## 2013-01-12 DIAGNOSIS — K529 Noninfective gastroenteritis and colitis, unspecified: Secondary | ICD-10-CM

## 2013-01-12 DIAGNOSIS — R197 Diarrhea, unspecified: Secondary | ICD-10-CM

## 2013-01-12 DIAGNOSIS — K921 Melena: Secondary | ICD-10-CM

## 2013-01-12 DIAGNOSIS — K5289 Other specified noninfective gastroenteritis and colitis: Secondary | ICD-10-CM

## 2013-01-12 HISTORY — DX: Other chronic pain: G89.29

## 2013-01-12 HISTORY — DX: Dorsalgia, unspecified: M54.9

## 2013-01-12 LAB — POCT CBC W AUTO DIFF (K'VILLE URGENT CARE)

## 2013-01-12 MED ORDER — IOHEXOL 300 MG/ML  SOLN
100.0000 mL | Freq: Once | INTRAMUSCULAR | Status: AC | PRN
  Administered 2013-01-12: 100 mL via INTRAVENOUS

## 2013-01-12 MED ORDER — CIPROFLOXACIN HCL 500 MG PO TABS: 500.0000 mg | ORAL_TABLET | Freq: Two times a day (BID) | ORAL | Status: AC

## 2013-01-12 MED ORDER — METRONIDAZOLE 500 MG PO TABS: 500.0000 mg | ORAL_TABLET | Freq: Three times a day (TID) | ORAL | Status: AC

## 2013-01-12 NOTE — ED Notes (Signed)
Pt c/o nausea, diarrhea, dizziness and abd cramping yesterday. Today he reports diarrhea x 1 with some bright red blood and LLQ abd pain. He has taken pepto.

## 2013-01-12 NOTE — ED Provider Notes (Addendum)
History     CSN: [id number]  Arrival date & time 01/12/13  1104   First MD Initiated Contact with Patient 01/12/13 1127      Chief Complaint  Patient presents with  . Diarrhea  . Abdominal Pain   HPI  Patient presents today with chief complaint of abdominal pain and bloody diarrhea x1. Patient states she noticed symptoms of diarrhea since yesterday as well as mild left lower quadrant abdominal pain. No fevers or chills at home per patient. Patient states he had episode of diarrhea this morning when he noticed the small amount of blood in the toilet. Patient states this is never happened before. No black stools apart from Pepto-Bismol use. No prior history of hemorrhoids. No known history of inflammatory bowel disease. No family history of inflammatory bowel disease. No joint pains or rash. Patient denies any recent sexual activity. Patient is also had some mild nausea associated with symptoms. No vomiting.   Past Medical History  Diagnosis Date  . Chronic back pain     Past Surgical History  Procedure Laterality Date  . Lung surgery      x2 - did not elaborate on intake form  . Wrist surgery    . Appendectomy    . Wrist surgery    . Nose surgery      Family History  Problem Relation Age of Onset  . Heart attack Mother   . Heart attack Father   . Stroke Father   . Heart attack Maternal Grandfather   . Sudden death Maternal Grandfather   . Diabetes Neg Hx   . Hyperlipidemia Neg Hx   . Hypertension Neg Hx     History  Substance Use Topics  . Smoking status: Current Every Day Smoker -- 1.50 packs/day    Types: Cigarettes  . Smokeless tobacco: Not on file  . Alcohol Use: Yes      Review of Systems  All other systems reviewed and are negative.    Allergies  Oxycodone-acetaminophen  Home Medications   Current Outpatient Rx  Name  Route  Sig  Dispense  Refill  . HYDROcodone-acetaminophen (NORCO) 7.5-325 MG per tablet   Oral   Take 1 tablet by mouth every 6  (six) hours as needed for pain.         Marland Kitchen ibuprofen (ADVIL,MOTRIN) 600 MG tablet   Oral   Take 600 mg by mouth every 6 (six) hours as needed for pain.         . cyclobenzaprine (FLEXERIL) 10 MG tablet               . predniSONE (DELTASONE) 10 MG tablet pack      6 tabs po day 1, 5 tabs po day 2, 4 tabs po day 3, 3 tabs po day 4, 2 tabs po day 5, 1 tab po day 6    21 tablet   0     BP 145/83  Pulse 80  Temp(Src) 98.1 F (36.7 C) (Oral)  Ht 6\' 4"  (1.93 m)  Wt 170 lb (77.111 kg)  BMI 20.7 kg/m2  SpO2 97%  Physical Exam  Constitutional: He appears well-developed and well-nourished.  HENT:  Head: Normocephalic and atraumatic.  Right Ear: External ear normal.  Left Ear: External ear normal.  Eyes: Conjunctivae are normal. Pupils are equal, round, and reactive to light.  Neck: Normal range of motion. Neck supple.  Cardiovascular: Normal rate, regular rhythm and normal heart sounds.   Pulmonary/Chest: Effort normal and  breath sounds normal.  Abdominal: Soft.  + hyperactive bowel sounds + LLQ TTP  Genitourinary: Rectum normal.  No internal hemorrhoids on anoscopic exam.    Musculoskeletal: Normal range of motion.  Neurological: He is alert.  Skin: Skin is warm.    ED Course  Procedures (including critical care time)  Labs Reviewed  POCT CBC W AUTO DIFF ([facility])   Ct Abdomen Pelvis W Contrast  01/12/2013  *RADIOLOGY REPORT*  Clinical Data: Bloody diarrhea, left lower quadrant pain  CT ABDOMEN AND PELVIS WITH CONTRAST  Technique:  Multidetector CT imaging of the abdomen and pelvis was performed following the standard protocol during bolus administration of intravenous contrast.  Contrast: OMNIPAQUE IOHEXOL 300 MG/ML  SOLN  Comparison: None.  Findings: The lung bases are clear.  The liver enhances with no focal abnormality and no ductal dilatation is seen.  No calcified gallstones are noted.  The pancreas is normal in size and the pancreatic duct  is not dilated.  The adrenal glands and spleen are unremarkable.  The stomach is decompressed.  The kidneys enhance with no calculus or mass and no hydronephrosis is seen.  The abdominal aorta is normal in caliber.  No adenopathy is seen.  The urinary bladder is not well distended and is minimally thick- walled of questionable significance.  The prostate appears to be within normal limits in size.  Although the descending colon is not well distended, there is suggestion of extensive mucosal edema throughout the descending colon most consistent with colitis. Clinical correlation is recommended.  The transverse colon and right colon are well visualized and appear normal.  The terminal ileum and appendix are unremarkable.  No acute skeletal abnormality is seen.  IMPRESSION: Although the descending colon is not well distended, there is suggestion of mucosal edema throughout the descending colon which may indicate colitis.  Correlate clinically.   Original Report Authenticated By: Dwyane Dee, M.D.      1. Colitis       MDM  Likely infectious in etiology. Noted white blood cell count of 13 today. Hemodynamically stable. Will start patient on Cipro Flagyl for infectious coverage. Clinically no signs of EHEC. Platelets are within normal limits on CBC today. Stool culture and stool lactoferrin ordered. We'll also check baseline labs including HIV and cemented. Will also check C. difficile. Discuss infectious and GI red flags in length. Discussed with patient that if hematochezia worsens to go to the ER. There is only a scant amount of my exam today anoscopic exam was within normal limits. Plan for followup PCP in the next 3-5 days for general reevaluation.    The patient and/or caregiver has been counseled thoroughly with regard to treatment plan and/or medications prescribed including dosage, schedule, interactions, rationale for use, and possible side effects and they verbalize understanding. Diagnoses and  expected course of recovery discussed and will return if not improved as expected or if the condition worsens. Patient and/or caregiver verbalized understanding.             Doree Albee, MD 01/12/13 1500  Doree Albee, MD 01/12/13 1501

## 2013-01-13 LAB — COMPREHENSIVE METABOLIC PANEL
Albumin: 4.9 g/dL (ref 3.5–5.2)
Alkaline Phosphatase: 101 U/L (ref 39–117)
Calcium: 9.7 mg/dL (ref 8.4–10.5)
Chloride: 101 mEq/L (ref 96–112)
Creat: 0.65 mg/dL (ref 0.50–1.35)
Sodium: 137 mEq/L (ref 135–145)
Total Bilirubin: 0.4 mg/dL (ref 0.3–1.2)
Total Protein: 7.2 g/dL (ref 6.0–8.3)

## 2013-01-13 LAB — HIV ANTIBODY (ROUTINE TESTING W REFLEX): HIV: NONREACTIVE

## 2013-01-15 LAB — CLOSTRIDIUM DIFFICILE BY PCR: Toxigenic C. Difficile by PCR: NOT DETECTED

## 2013-01-17 LAB — STOOL CULTURE

## 2013-01-18 ENCOUNTER — Telehealth: Payer: Self-pay | Admitting: *Deleted

## 2013-09-07 ENCOUNTER — Other Ambulatory Visit: Payer: Self-pay | Admitting: Neurosurgery

## 2013-09-07 DIAGNOSIS — M5126 Other intervertebral disc displacement, lumbar region: Secondary | ICD-10-CM

## 2013-10-05 ENCOUNTER — Ambulatory Visit
Admission: RE | Admit: 2013-10-05 | Discharge: 2013-10-05 | Disposition: A | Discharge status: Home / Self Care | Payer: BC Managed Care – PPO | Source: Ambulatory Visit | Attending: Neurosurgery | Admitting: Neurosurgery

## 2013-10-05 VITALS — BP 147/93 | HR 78

## 2013-10-05 DIAGNOSIS — M5126 Other intervertebral disc displacement, lumbar region: Secondary | ICD-10-CM

## 2013-10-05 DIAGNOSIS — M545 Low back pain: Secondary | ICD-10-CM

## 2013-10-05 MED ORDER — ONDANSETRON HCL 4 MG/2ML IJ SOLN
4.0000 mg | Freq: Once | INTRAMUSCULAR | Status: AC
  Administered 2013-10-05: 4 mg via INTRAMUSCULAR

## 2013-10-05 MED ORDER — ONDANSETRON HCL 4 MG/2ML IJ SOLN: 4.0000 mg | Freq: Four times a day (QID) | INTRAMUSCULAR | Status: DC | PRN

## 2013-10-05 MED ORDER — HYDROMORPHONE HCL PF 2 MG/ML IJ SOLN
2.0000 mg | Freq: Once | INTRAMUSCULAR | Status: AC
  Administered 2013-10-05: 2 mg via INTRAMUSCULAR

## 2013-10-05 MED ORDER — DIAZEPAM 5 MG PO TABS
10.0000 mg | ORAL_TABLET | Freq: Once | ORAL | Status: AC
  Administered 2013-10-05: 10 mg via ORAL

## 2013-10-05 MED ORDER — IOHEXOL 180 MG/ML  SOLN
15.0000 mL | Freq: Once | INTRAMUSCULAR | Status: AC | PRN
  Administered 2013-10-05: 15 mL via INTRATHECAL

## 2013-10-07 ENCOUNTER — Other Ambulatory Visit: Payer: Self-pay

## 2014-08-10 IMAGING — CT CT L SPINE W/ CM
4 of 10 series · 12 of 33 positions shown, 14 images · non-contrast
Comparison: 07/24/2013.

CLINICAL DATA: Chronic low back pain.

EXAM:
LUMBAR MYELOGRAM
TECHNIQUE: Contiguous axial images were obtained through the lumbar spine
without infusion. Coronal, sagittal, and disc space reconstructions
were obtained of the axial image sets.

[Series 2: l spine bone · axial · 0.34mm/px · z∈[-37,+46]mm · 2 of 99 slices shown, 3 images]
[im 33/99  soft-tissue]
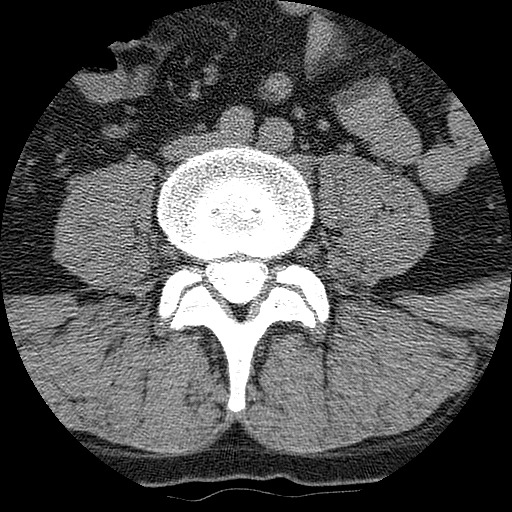
[im 33/99  bone]
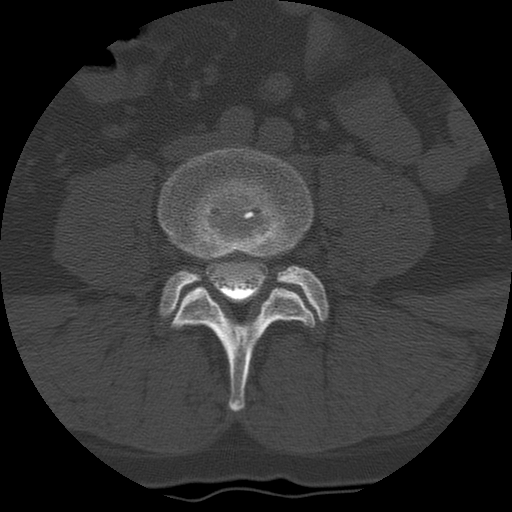
[im 66/99  bone]
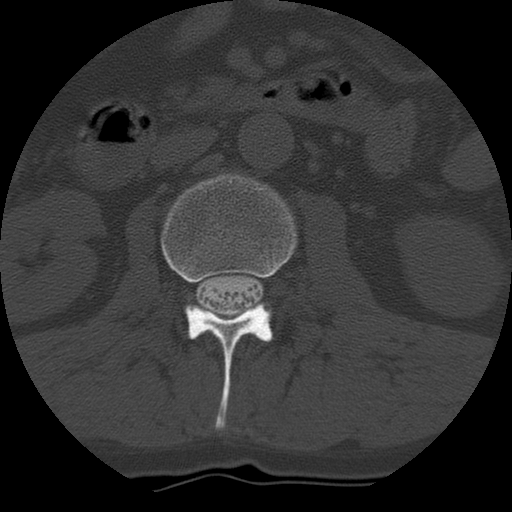

[Series 3: l spine soft · axial · 0.34mm/px · z∈[-34,+46]mm · 2 of 98 slices shown]
[im 33/98  soft-tissue]
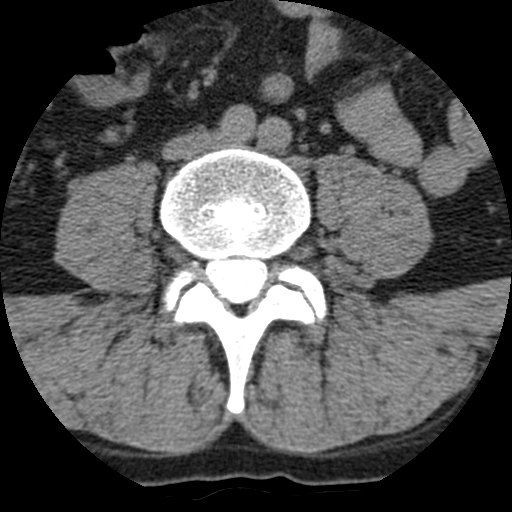
[im 65/98  soft-tissue]
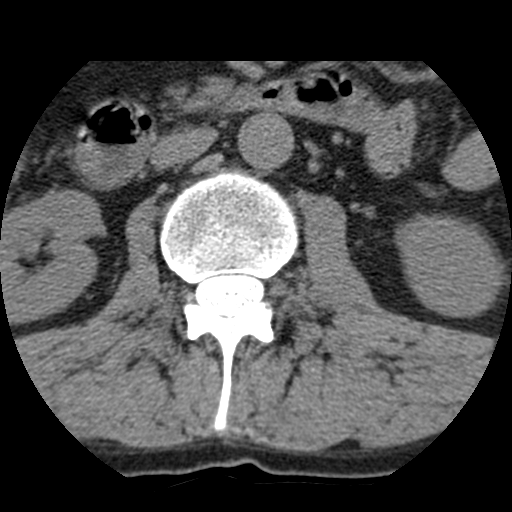

[Series 400: sagittal · sagittal · 0.49mm/px · 5 of 55 slices shown, 6 images]
[im 19/55  bone]
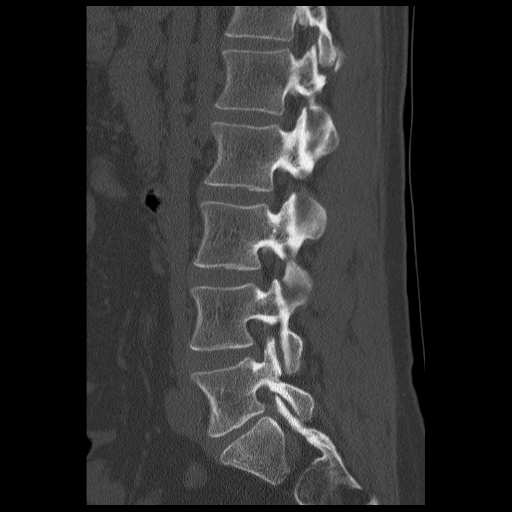
[im 23/55  bone]
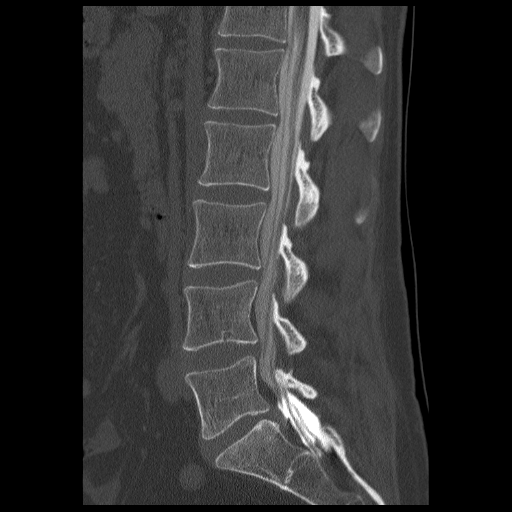
[im 28/55  soft-tissue]
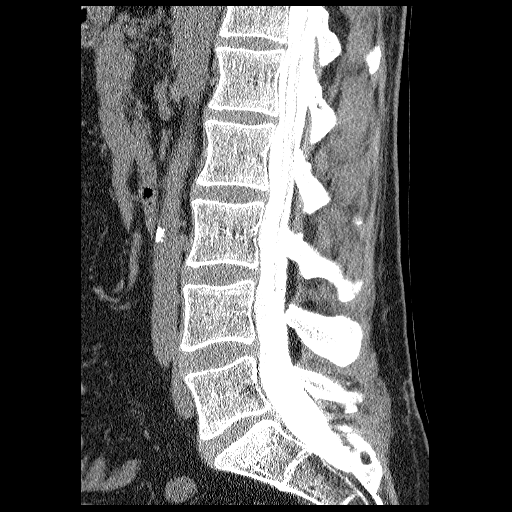
[im 28/55  bone]
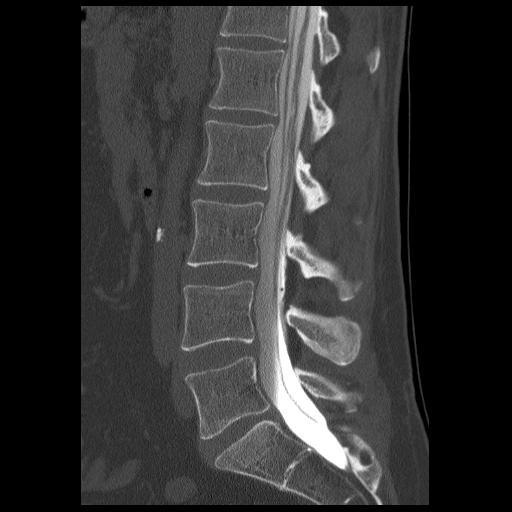
[im 32/55  bone]
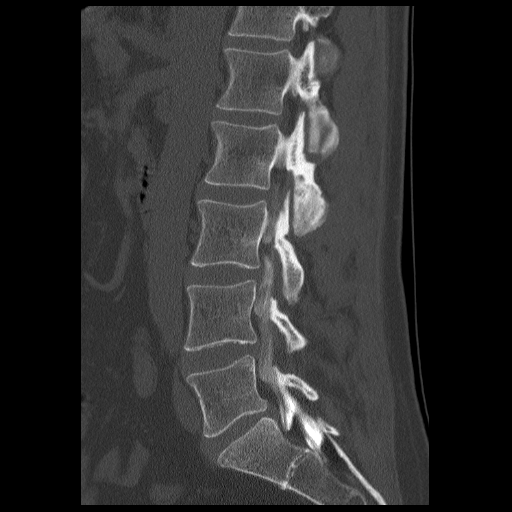
[im 37/55  bone]
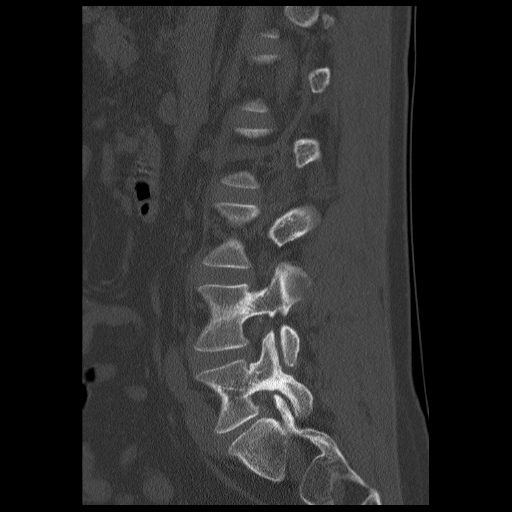

[Series 401: coronal · coronal · 0.49mm/px · 3 of 55 slices shown]
[im 11/55  bone]
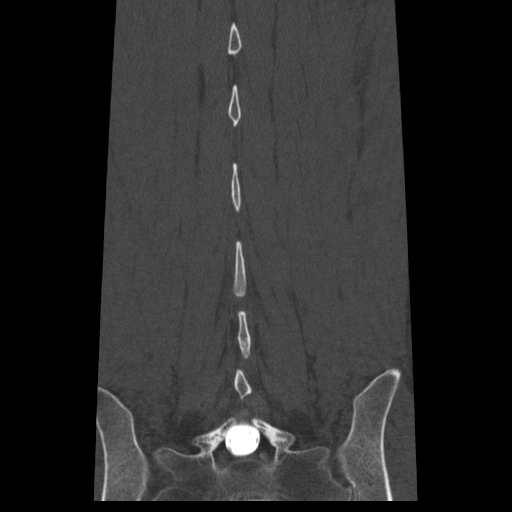
[im 22/55  bone]
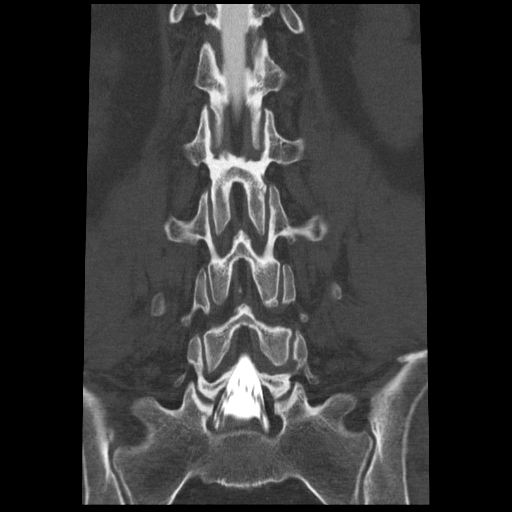
[im 33/55  bone]
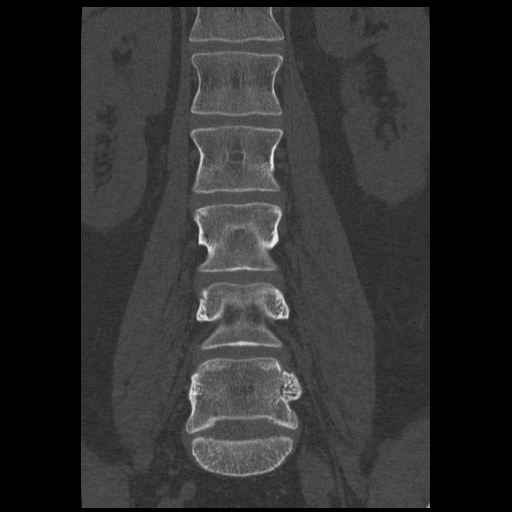

[12 of 33 positions shown; findings below may reference images not displayed]

FLUOROSCOPY TIME:  Zero Min is 35 seconds

PROCEDURE:
After thorough discussion of risks and benefits of the procedure
including bleeding, infection, injury to nerves, blood vessels,
adjacent structures as well as headache and CSF leak, written and
oral informed consent was obtained. Consent was obtained by Dr.
Wisa Denan. Time out form was completed.

Patient was positioned prone on the fluoroscopy table. Local
anesthesia was provided with 1% lidocaine without epinephrine after
prepped and draped in the usual sterile fashion. Puncture was
performed at L3-L4 using a 3-1/2 inch 22 gauge pencil point via
right paramedian approach. Using a single pass through the dura, the
needle was placed within the thecal sac, with return of clear CSF.
15 mL of Kmnipaque-7XS was injected into the thecal sac, with normal
opacification of the nerve roots and cauda equina consistent with
free flow within the subarachnoid space.

I personally performed the lumbar puncture and administered the
intrathecal contrast. I also personally supervised acquisition of
the myelogram images.
FINDINGS: LUMBAR MYELOGRAM FINDINGS:

Mild grade I and retrolisthesis of L5 on S1 measuring 3 mm. No
instability on flexion and extension. Shallow disc bulging is
present at L3-L4 and L4-L5. Disc bulging is also present at L5-S1.
There is mild underfilling of the right S1 nerve root sleeve at
L5-S1 which may be due to fibrosis. There is no significant central
or lateral recess stenosis at the other levels. No pathologic motion
with flexion and extension maneuvers. The L5-S1 disc protrusion
becomes more prominent with neutral and extension and decreases in
conspicuity with flexion.

CT LUMBAR MYELOGRAM FINDINGS:

Alignment shows minimal grade I retrolisthesis of L5 on S1
associated with posterior disc space collapse, measuring 3 mm on CT.
Vertebral body height is preserved. Spinal cord terminates posterior
to the L1 vertebra. The paraspinal soft tissues are normal aside
from mild aortic atherosclerosis. SI joints appear normal.
Intervertebral discs from T12-L1 through L3-L4 appear normal.

L4-L5: Shallow broad-based posterior disk bulging without stenosis.
The facet joints appear normal bilaterally. The lateral recesses are
patent. Central canal is patent. Foramina appear patent as well.

L5-S1: No laminotomy defect is identified however there is scarring
in the paraspinal soft tissues to the right of midline. Findings are
compatible with prior right L5-S1 partial discectomy. There is
normal filling of the S1 nerve root sleeves bilaterally. There is a
shallow central protrusion or scarring that approaches the
descending S1 nerves as they approach the lateral recess, best seen
on the dedicated axial images (image 11 series 108) which could
potentially produce some irritation. No compressive stenosis. The
foramina appear adequately patent despite shallow disc bulging that
extends into both neural foramina. Failure of fusion of the
posterior elements of S1 is incidentally noted.
IMPRESSION: 1. Technically successful lumbar puncture for lumbar myelogram.
2. L5-S1 predominant degenerative disc disease with postsurgical
changes of partial discectomy. Shallow central disc protrusion or
scarring just contacts the descending right S1 nerve approaching the
lateral recess. No neural compression or effacement of contrast
within the nerve root sleeve.
3. Mild disk bulging at L3-L4 and L4-L5. No resulting stenosis.

## 2014-08-10 IMAGING — RF DG MYELOGRAM LUMBAR
13 of 20 series · 13 of 20 positions shown · non-contrast
Comparison: 07/24/2013.

CLINICAL DATA: Chronic low back pain.

EXAM:
LUMBAR MYELOGRAM
TECHNIQUE: Contiguous axial images were obtained through the lumbar spine
without infusion. Coronal, sagittal, and disc space reconstructions
were obtained of the axial image sets.

[Series 1: (hospital) · 1 of 1 slices shown]
[im 1/1]
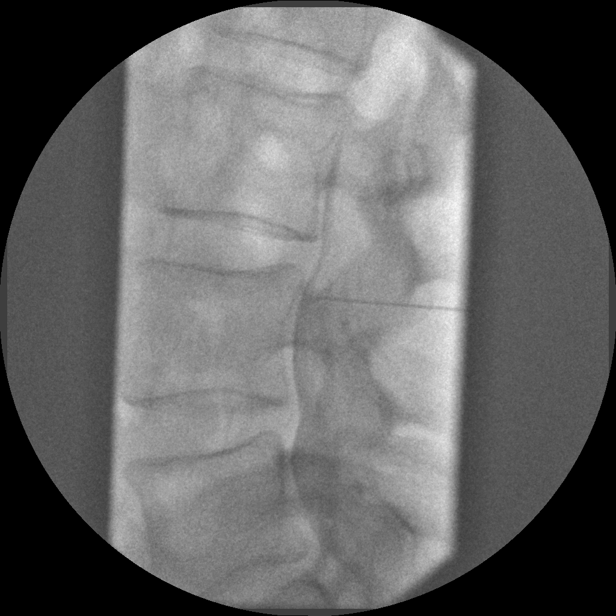

[Series 3: myelogram  white · 1 of 1 slices shown (1 of 9)]
[im 1/1]
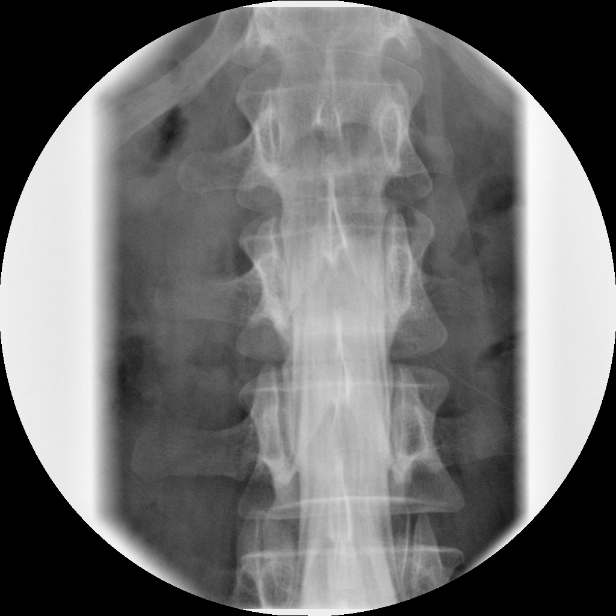

[Series 4: myelogram  white · 1 of 1 slices shown (2 of 9)]
[im 1/1]
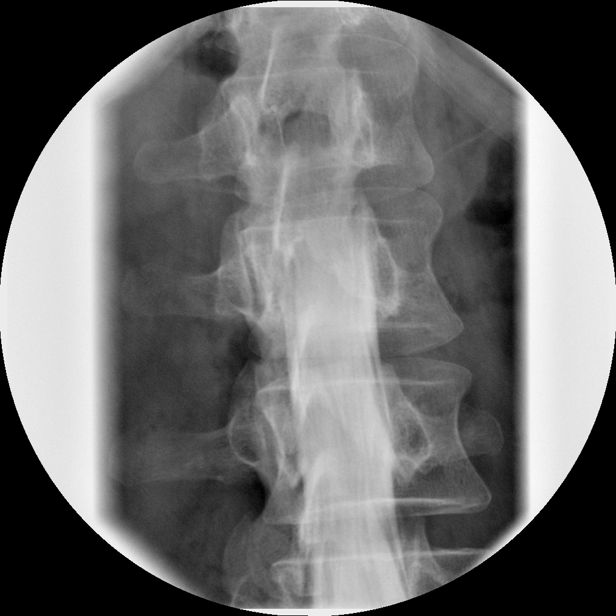

[Series 6: myelogram  white · 1 of 1 slices shown (3 of 9)]
[im 1/1]
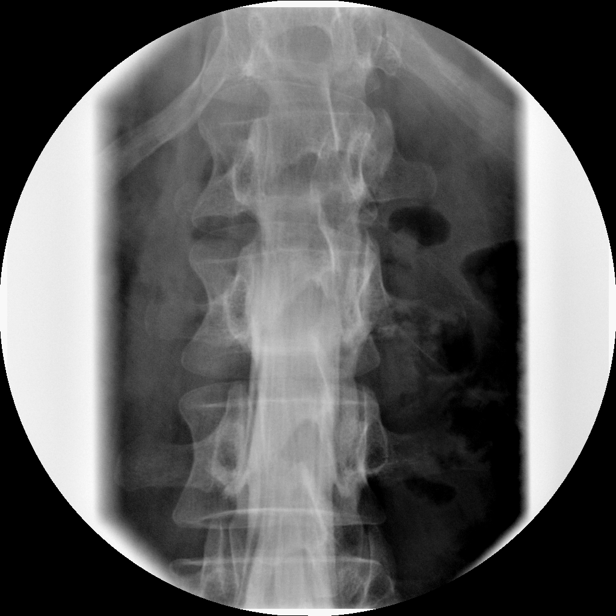

[Series 7: myelogram  white · 1 of 1 slices shown (4 of 9)]
[im 1/1]
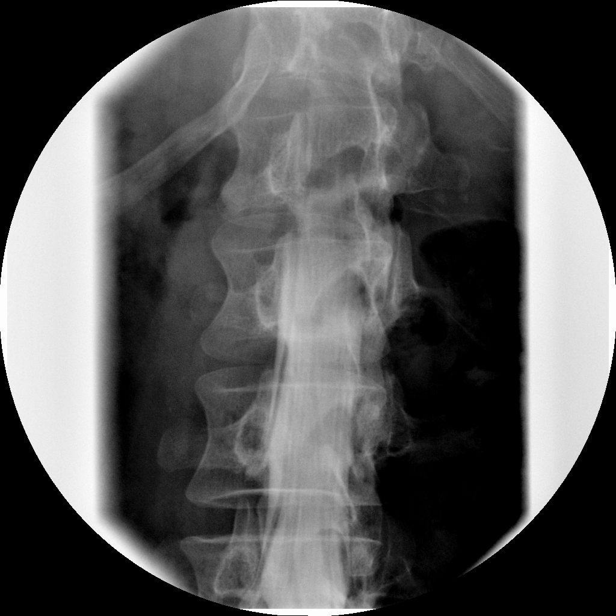

[Series 9: myelogram  white · 1 of 1 slices shown (5 of 9)]
[im 1/1]
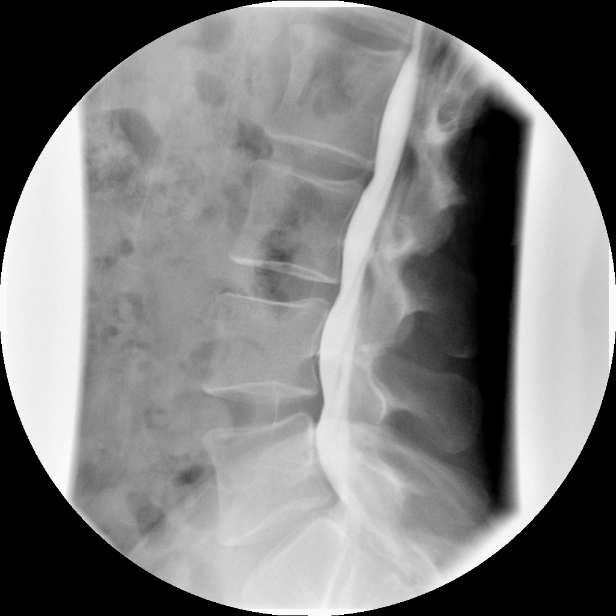

[Series 12: myelogram  white · 1 of 1 slices shown (6 of 9)]
[im 1/1]
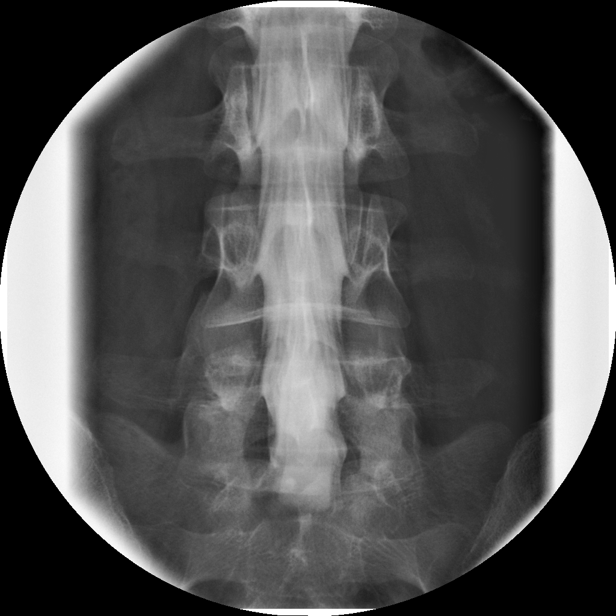

[Series 13: myelogram  white · 1 of 1 slices shown (7 of 9)]
[im 1/1]
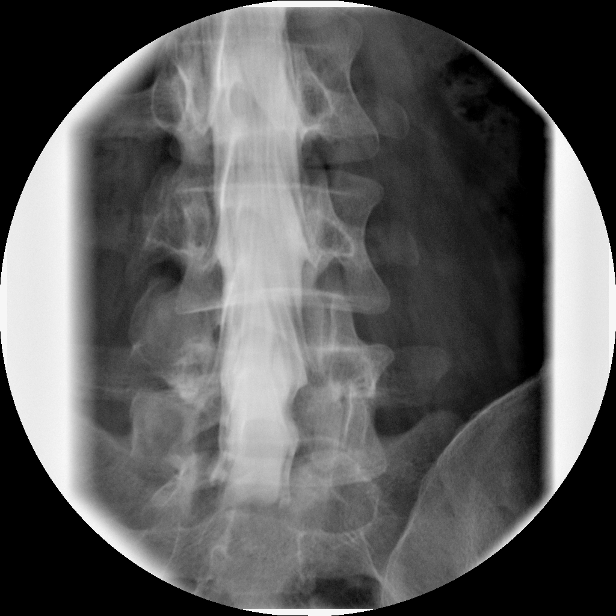

[Series 15: myelogram  white · 1 of 1 slices shown (8 of 9)]
[im 1/1]
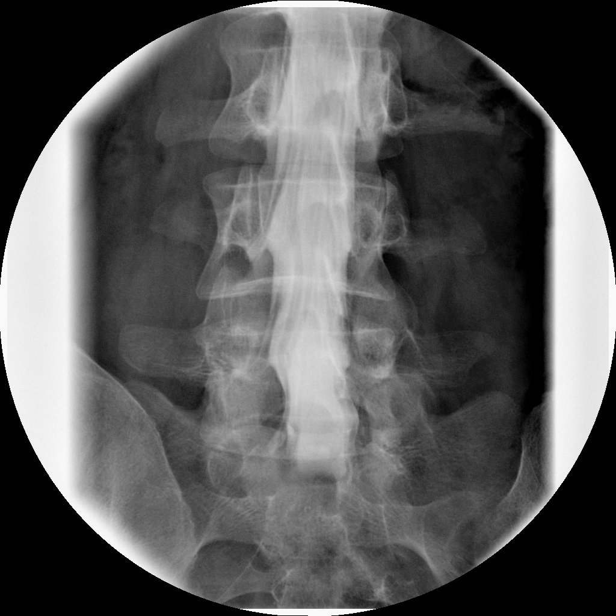

[Series 16: myelogram  white · 1 of 1 slices shown (9 of 9)]
[im 1/1]
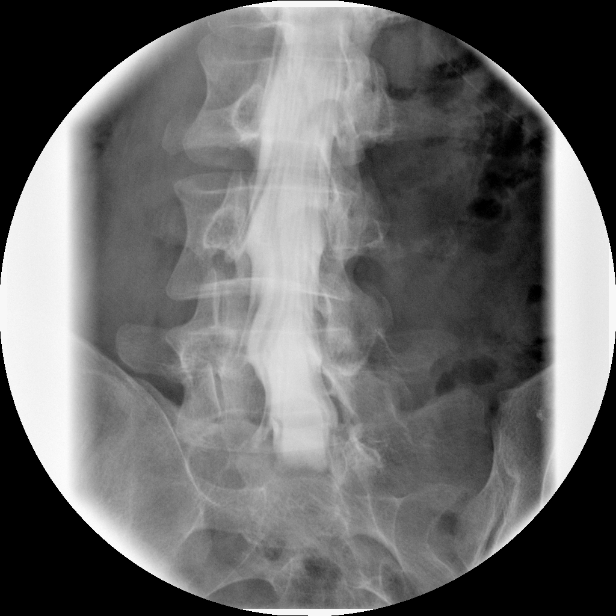

[Series 1001: view not recorded · 0.20mm/px · 1 of 1 slices shown (1 of 3)]
[im 1/1]
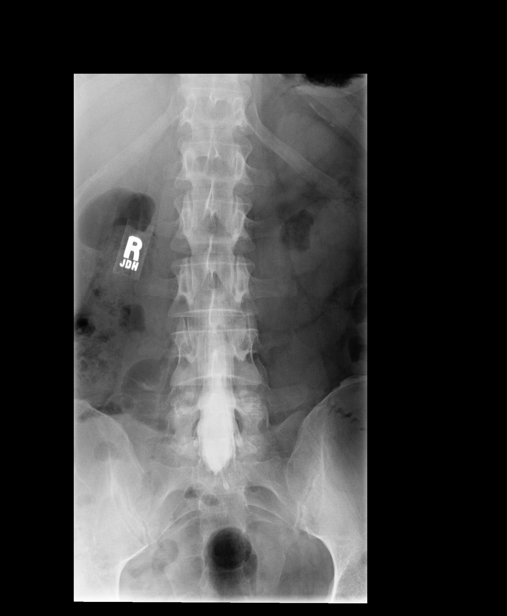

[Series 1002: view not recorded · 0.20mm/px · 1 of 1 slices shown (2 of 3)]
[im 1/1]
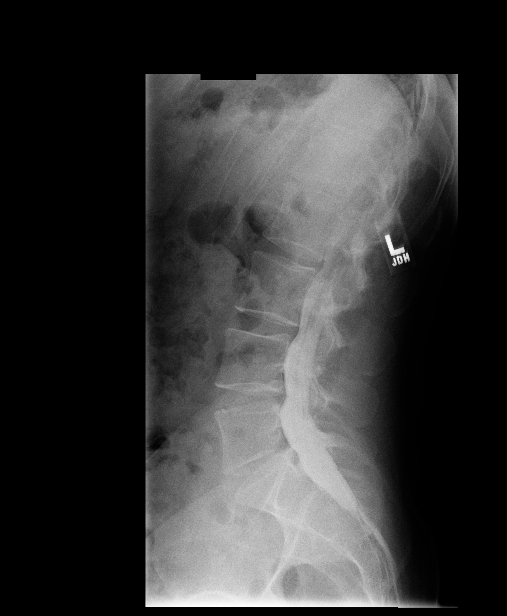

[Series 1004: view not recorded · 0.20mm/px · 1 of 1 slices shown (3 of 3)]
[im 1/1]
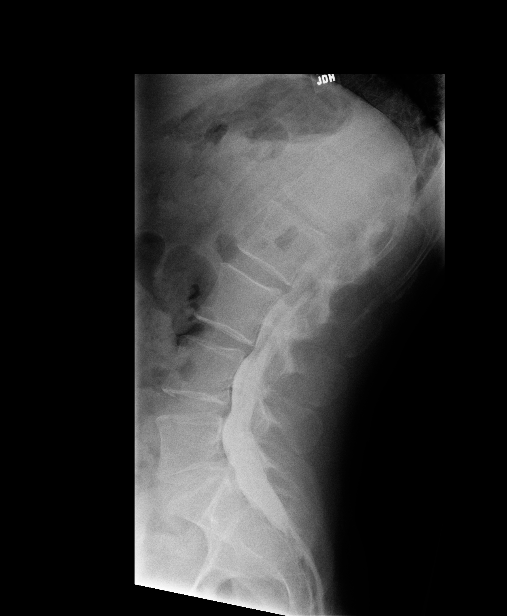

[13 of 20 positions shown; findings below may reference images not displayed]

FLUOROSCOPY TIME:  Zero Min is 35 seconds

PROCEDURE:
After thorough discussion of risks and benefits of the procedure
including bleeding, infection, injury to nerves, blood vessels,
adjacent structures as well as headache and CSF leak, written and
oral informed consent was obtained. Consent was obtained by Dr.
Tudzi Malm . Time out form was completed.

Patient was positioned prone on the fluoroscopy table. Local
anesthesia was provided with 1% lidocaine without epinephrine after
prepped and draped in the usual sterile fashion. Puncture was
performed at L3-L4 using a 3-1/2 inch 22 gauge pencil point via
right paramedian approach. Using a single pass through the dura, the
needle was placed within the thecal sac, with return of clear CSF.
15 mL of [id number] was injected into the thecal sac, with normal
opacification of the nerve roots and cauda equina consistent with
free flow within the subarachnoid space.

I personally performed the lumbar puncture and administered the
intrathecal contrast. I also personally supervised acquisition of
the myelogram images.
FINDINGS: LUMBAR MYELOGRAM FINDINGS:

Mild grade I and retrolisthesis of L5 on S1 measuring 3 mm. No
instability on flexion and extension. Shallow disc bulging is
present at L3-L4 and L4-L5. Disc bulging is also present at L5-S1.
There is mild underfilling of the right S1 nerve root sleeve at
L5-S1 which may be due to fibrosis. There is no significant central
or lateral recess stenosis at the other levels. No pathologic motion
with flexion and extension maneuvers. The L5-S1 disc protrusion
becomes more prominent with neutral and extension and decreases in
conspicuity with flexion.

CT LUMBAR MYELOGRAM FINDINGS:

Alignment shows minimal grade I retrolisthesis of L5 on S1
associated with posterior disc space collapse, measuring 3 mm on CT.
Vertebral body height is preserved. Spinal cord terminates posterior
to the L1 vertebra. The paraspinal soft tissues are normal aside
from mild aortic atherosclerosis. SI joints appear normal.
Intervertebral discs from T12-L1 through L3-L4 appear normal.

L4-L5: Shallow broad-based posterior disk bulging without stenosis.
The facet joints appear normal bilaterally. The lateral recesses are
patent. Central canal is patent. Foramina appear patent as well.

L5-S1: No laminotomy defect is identified however there is scarring
in the paraspinal soft tissues to the right of midline. Findings are
compatible with prior right L5-S1 partial discectomy. There is
normal filling of the S1 nerve root sleeves bilaterally. There is a
shallow central protrusion or scarring that approaches the
descending S1 nerves as they approach the lateral recess, best seen
on the dedicated axial images (image 11 series 108) which could
potentially produce some irritation. No compressive stenosis. The
foramina appear adequately patent despite shallow disc bulging that
extends into both neural foramina. Failure of fusion of the
posterior elements of S1 is incidentally noted.
IMPRESSION: 1. Technically successful lumbar puncture for lumbar myelogram.
2. L5-S1 predominant degenerative disc disease with postsurgical
changes of partial discectomy. Shallow central disc protrusion or
scarring just contacts the descending right S1 nerve approaching the
lateral recess. No neural compression or effacement of contrast
within the nerve root sleeve.
3. Mild disk bulging at L3-L4 and L4-L5. No resulting stenosis.

## 2019-12-31 ENCOUNTER — Ambulatory Visit: Payer: Self-pay

## 2020-01-08 ENCOUNTER — Ambulatory Visit: Payer: PRIVATE HEALTH INSURANCE | Attending: Internal Medicine

## 2020-01-08 DIAGNOSIS — Z23 Encounter for immunization: Secondary | ICD-10-CM

## 2020-01-08 NOTE — Progress Notes (Signed)
   Covid-19 Vaccination Clinic  Name:  Tyler Mcdaniel    MRN: [id number] DOB: [birth year]  01/08/2020  [name] was observed post Covid-19 immunization for 15 minutes without incidence. He was provided with Vaccine Information Sheet and instruction to access the V-Safe system.   [name] was instructed to call 911 with any severe reactions post vaccine: Marland Kitchen Difficulty breathing  . Swelling of your face and throat  . A fast heartbeat  . A bad rash all over your body  . Dizziness and weakness

## 2020-01-08 NOTE — Progress Notes (Signed)
   Covid-19 Vaccination Clinic  Name:  Tyler Mcdaniel    MRN: [id number] DOB: Apr 15, 1976  01/08/2020  Mr. Tyler Mcdaniel was observed post Covid-19 immunization for 15 minutes without incidence. He was provided with Vaccine Information Sheet and instruction to access the V-Safe system.   Mr. Tyler Mcdaniel was instructed to call 911 with any severe reactions post vaccine: Marland Kitchen Difficulty breathing  . Swelling of your face and throat  . A fast heartbeat  . A bad rash all over your body  . Dizziness and weakness    Immunizations Administered    Name Date Dose VIS Date Route   Pfizer COVID-19 Vaccine 01/08/2020  5:15 PM 0.3 mL 11/12/2019 Intramuscular   Manufacturer: [employer], [employer]   Lot: [id number]   NDC: [address]-4

## 2020-01-21 ENCOUNTER — Ambulatory Visit: Payer: Self-pay

## 2020-02-02 ENCOUNTER — Ambulatory Visit: Payer: PRIVATE HEALTH INSURANCE

## 2020-02-02 ENCOUNTER — Ambulatory Visit: Payer: PRIVATE HEALTH INSURANCE | Attending: Internal Medicine

## 2020-02-02 DIAGNOSIS — Z23 Encounter for immunization: Secondary | ICD-10-CM | POA: Insufficient documentation

## 2020-02-02 NOTE — Progress Notes (Signed)
   Covid-19 Vaccination Clinic  Name:  Tyler Mcdaniel    MRN: [id number] DOB: 29-Jan-1976  02/02/2020  Tyler Mcdaniel was observed post Covid-19 immunization for 15 minutes without incident. He was provided with Vaccine Information Sheet and instruction to access the V-Safe system.   Tyler Mcdaniel was instructed to call 911 with any severe reactions post vaccine: Marland Kitchen Difficulty breathing  . Swelling of face and throat  . A fast heartbeat  . A bad rash all over body  . Dizziness and weakness   Immunizations Administered    Name Date Dose VIS Date Route   Pfizer COVID-19 Vaccine 02/02/2020  8:54 AM 0.3 mL 11/12/2019 Intramuscular   Manufacturer: [employer], [employer]   Lot: [id number]   NDC: [address]-7
